# Patient Record
Sex: Female | Born: 2000
Health system: Southern US, Community
[De-identification: ages and names within clinical notes are randomized; demographics above are authoritative.]

## PROBLEM LIST (undated history)

## (undated) DIAGNOSIS — R32 Unspecified urinary incontinence: Secondary | ICD-10-CM

## (undated) DIAGNOSIS — T7422XA Child sexual abuse, confirmed, initial encounter: Secondary | ICD-10-CM

## (undated) DIAGNOSIS — D649 Anemia, unspecified: Secondary | ICD-10-CM

## (undated) DIAGNOSIS — Z30017 Encounter for initial prescription of implantable subdermal contraceptive: Principal | ICD-10-CM

## (undated) DIAGNOSIS — K219 Gastro-esophageal reflux disease without esophagitis: Secondary | ICD-10-CM

## (undated) DIAGNOSIS — F909 Attention-deficit hyperactivity disorder, unspecified type: Secondary | ICD-10-CM

## (undated) HISTORY — DX: Unspecified urinary incontinence: R32

## (undated) HISTORY — DX: Anemia, unspecified: D64.9

## (undated) HISTORY — DX: Encounter for initial prescription of implantable subdermal contraceptive: Z30.017

## (undated) HISTORY — DX: Gastro-esophageal reflux disease without esophagitis: K21.9

---
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
## 2019-11-27 ENCOUNTER — Ambulatory Visit: Payer: Medicaid Other | Attending: Internal Medicine

## 2019-11-27 DIAGNOSIS — Z23 Encounter for immunization: Secondary | ICD-10-CM

## 2019-11-27 NOTE — Progress Notes (Signed)
   Covid-19 Vaccination Clinic  Name:  Lisa Gregory    MRN: [id number] DOB: 04-01-2001  11/27/2019  [name] was observed post Covid-19 immunization for 15 minutes without incident. She was provided with Vaccine Information Sheet and instruction to access the V-Safe system.   [name] was instructed to call 911 with any severe reactions post vaccine: Marland Kitchen Difficulty breathing  . Swelling of face and throat  . A fast heartbeat  . A bad rash all over body  . Dizziness and weakness   Immunizations Administered    Name Date Dose VIS Date Route   Pfizer COVID-19 Vaccine 11/27/2019  2:33 PM 0.3 mL 06/04/2018 Intramuscular   Manufacturer: [employer], [employer]   Lot: [id number]   NDC: [address]-7

## 2019-12-18 ENCOUNTER — Ambulatory Visit: Payer: Medicaid Other | Attending: Internal Medicine

## 2019-12-18 DIAGNOSIS — Z23 Encounter for immunization: Secondary | ICD-10-CM

## 2019-12-18 NOTE — Progress Notes (Signed)
   Covid-19 Vaccination Clinic  Name:  Lisa Gregory    MRN: [id number] DOB: 2001-03-03  12/18/2019  Ms. Lisa Gregory was observed post Covid-19 immunization for 15 minutes without incident. She was provided with Vaccine Information Sheet and instruction to access the V-Safe system.   Ms. Lisa Gregory was instructed to call 911 with any severe reactions post vaccine: Marland Kitchen Difficulty breathing  . Swelling of face and throat  . A fast heartbeat  . A bad rash all over body  . Dizziness and weakness   Immunizations Administered    Name Date Dose VIS Date Route   Pfizer COVID-19 Vaccine 12/18/2019  2:19 PM 0.3 mL 06/04/2018 Intramuscular   Manufacturer: [employer], [employer]   Lot: [id number]   NDC: [address]-4

## 2020-02-28 DIAGNOSIS — Z79899 Other long term (current) drug therapy: Secondary | ICD-10-CM | POA: Insufficient documentation

## 2020-02-28 DIAGNOSIS — R002 Palpitations: Secondary | ICD-10-CM | POA: Diagnosis not present

## 2020-02-29 ENCOUNTER — Emergency Department (HOSPITAL_COMMUNITY)
Admission: EM | Admit: 2020-02-29 | Discharge: 2020-02-29 | Disposition: A | Discharge status: Home / Self Care | Payer: Medicaid Other | Attending: Emergency Medicine | Admitting: Emergency Medicine

## 2020-02-29 ENCOUNTER — Emergency Department (HOSPITAL_COMMUNITY): Payer: Medicaid Other

## 2020-02-29 ENCOUNTER — Encounter (HOSPITAL_COMMUNITY): Payer: Self-pay | Admitting: Emergency Medicine

## 2020-02-29 ENCOUNTER — Other Ambulatory Visit: Payer: Self-pay

## 2020-02-29 DIAGNOSIS — R002 Palpitations: Secondary | ICD-10-CM

## 2020-02-29 LAB — COMPREHENSIVE METABOLIC PANEL
ALT: 17 U/L (ref 0–44)
AST: 20 U/L (ref 15–41)
Albumin: 4 g/dL (ref 3.5–5.0)
Alkaline Phosphatase: 39 U/L (ref 38–126)
Anion gap: 7 (ref 5–15)
BUN: 14 mg/dL (ref 6–20)
CO2: 22 mmol/L (ref 22–32)
Calcium: 9.3 mg/dL (ref 8.9–10.3)
Chloride: 107 mmol/L (ref 98–111)
Creatinine, Ser: 0.58 mg/dL (ref 0.44–1.00)
GFR, Estimated: >60 mL/min (ref >60)
Glucose, Bld: 92 mg/dL (ref 70–99)
Potassium: 3.4 mmol/L — ABNORMAL LOW (ref 3.5–5.1)
Sodium: 136 mmol/L (ref 135–145)
Total Bilirubin: 0.3 mg/dL (ref 0.3–1.2)
Total Protein: 7.8 g/dL (ref 6.5–8.1)

## 2020-02-29 LAB — CBC WITH DIFFERENTIAL/PLATELET
Abs Immature Granulocytes: 0.02 10*3/uL (ref 0.00–0.07)
Basophils Absolute: 0.1 10*3/uL (ref 0.0–0.1)
Basophils Relative: 1 %
Eosinophils Absolute: 0 10*3/uL (ref 0.0–0.5)
Eosinophils Relative: 0 %
HCT: 32.1 % — ABNORMAL LOW (ref 36.0–46.0)
Hemoglobin: 10.2 g/dL — ABNORMAL LOW (ref 12.0–15.0)
Immature Granulocytes: 0 %
Lymphocytes Relative: 26 %
Lymphs Abs: 2.3 10*3/uL (ref 0.7–4.0)
MCH: 27.3 pg (ref 26.0–34.0)
MCHC: 31.8 g/dL (ref 30.0–36.0)
MCV: 85.8 fL (ref 80.0–100.0)
Monocytes Absolute: 0.9 10*3/uL (ref 0.1–1.0)
Monocytes Relative: 10 %
Neutro Abs: 5.7 10*3/uL (ref 1.7–7.7)
Neutrophils Relative %: 63 %
Platelets: 306 10*3/uL (ref 150–400)
RBC: 3.74 MIL/uL — ABNORMAL LOW (ref 3.87–5.11)
RDW: 15.2 % (ref 11.5–15.5)
WBC: 8.9 10*3/uL (ref 4.0–10.5)
nRBC: 0 % (ref 0.0–0.2)

## 2020-02-29 LAB — TROPONIN I (HIGH SENSITIVITY)
Troponin I (High Sensitivity): <2 ng/L (ref <18)
Troponin I (High Sensitivity): <2 ng/L (ref <18)

## 2020-02-29 LAB — MAGNESIUM: Magnesium: 1.8 mg/dL (ref 1.7–2.4)

## 2020-02-29 LAB — D-DIMER, QUANTITATIVE: D-Dimer, Quant: 0.55 ug/mL-FEU — ABNORMAL HIGH (ref 0.00–0.50)

## 2020-02-29 LAB — HCG, SERUM, QUALITATIVE: Preg, Serum: NEGATIVE

## 2020-02-29 LAB — TSH: TSH: 1.598 u[IU]/mL (ref 0.350–4.500)

## 2020-02-29 MED ORDER — IOHEXOL 350 MG/ML SOLN
100.0000 mL | Freq: Once | INTRAVENOUS | Status: AC | PRN
  Administered 2020-02-29: 100 mL via INTRAVENOUS

## 2020-02-29 NOTE — ED Notes (Signed)
Patient transported to CT 

## 2020-02-29 NOTE — Discharge Instructions (Addendum)
Please avoid caffeine-containing products, these will make your heart race.  [facility] has a lot of caffeine in it.  If you continue to feel like your heart is racing you can be evaluated by cardiology.  Call their office to get an appointment.

## 2020-02-29 NOTE — ED Notes (Signed)
Pt awaiting to go for CT Scan at this time. CT notified of negative pregnancy result.
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
## 2020-02-29 NOTE — ED Triage Notes (Signed)
Pt here from work. States she started feeling her heart race after drinking an energy drink at work [employer]. States she usually doesn't drink energy drinks.

## 2020-04-07 ENCOUNTER — Other Ambulatory Visit: Payer: Self-pay

## 2020-04-07 ENCOUNTER — Ambulatory Visit (INDEPENDENT_AMBULATORY_CARE_PROVIDER_SITE_OTHER): Payer: Medicaid Other | Admitting: Nurse Practitioner

## 2020-04-07 ENCOUNTER — Encounter: Payer: Self-pay | Admitting: Nurse Practitioner

## 2020-04-07 VITALS — BP 121/74 | HR 87 | Temp 98.4°F | Ht 64.0 in | Wt 142.6 lb

## 2020-04-07 DIAGNOSIS — K219 Gastro-esophageal reflux disease without esophagitis: Secondary | ICD-10-CM | POA: Insufficient documentation

## 2020-04-07 DIAGNOSIS — Z7689 Persons encountering health services in other specified circumstances: Secondary | ICD-10-CM

## 2020-04-07 NOTE — Assessment & Plan Note (Signed)
Patient is a 19 year old female who is establishing care today.  Patient has no new concerns, completed head to toe assessment.  Provided education to patient on health maintenance and preventative care.  Patient will schedule a 3 months appointment for  physical to complete labs and PAP.

## 2020-04-07 NOTE — Patient Instructions (Signed)
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
## 2020-04-07 NOTE — Assessment & Plan Note (Signed)
Patient is a 19 year old female who presents to clinic following up for GERD.  Patient is well controlled on current medication Nexium 40 mg capsule daily.  No changes to current medication.  Continue to advise patient to eat a healthy diet and exercise as tolerated.

## 2020-07-07 ENCOUNTER — Encounter: Payer: Self-pay | Admitting: Nurse Practitioner

## 2020-07-07 ENCOUNTER — Other Ambulatory Visit (HOSPITAL_COMMUNITY)
Admission: RE | Admit: 2020-07-07 | Discharge: 2020-07-07 | Disposition: A | Discharge status: Home / Self Care | Payer: Medicaid Other | Source: Ambulatory Visit | Attending: Nurse Practitioner | Admitting: Nurse Practitioner

## 2020-07-07 ENCOUNTER — Other Ambulatory Visit: Payer: Self-pay

## 2020-07-07 ENCOUNTER — Ambulatory Visit: Payer: Medicaid Other | Admitting: Nurse Practitioner

## 2020-07-07 VITALS — BP 125/76 | HR 103 | Temp 97.2°F | Ht 64.0 in | Wt 143.0 lb

## 2020-07-07 DIAGNOSIS — Z01419 Encounter for gynecological examination (general) (routine) without abnormal findings: Secondary | ICD-10-CM | POA: Insufficient documentation

## 2020-07-07 DIAGNOSIS — Z01411 Encounter for gynecological examination (general) (routine) with abnormal findings: Secondary | ICD-10-CM | POA: Diagnosis not present

## 2020-07-07 DIAGNOSIS — Z0000 Encounter for general adult medical examination without abnormal findings: Secondary | ICD-10-CM

## 2020-07-07 DIAGNOSIS — Z0001 Encounter for general adult medical examination with abnormal findings: Secondary | ICD-10-CM | POA: Diagnosis not present

## 2020-07-07 DIAGNOSIS — K219 Gastro-esophageal reflux disease without esophagitis: Secondary | ICD-10-CM | POA: Diagnosis not present

## 2020-07-07 NOTE — Assessment & Plan Note (Signed)
Symptoms of GERD well controlled on Nexium 40 mg tablet by mouth daily.

## 2020-07-07 NOTE — Assessment & Plan Note (Signed)
No signs or symptoms of abnormality.  Completed Pap- Results pending.

## 2020-07-07 NOTE — Assessment & Plan Note (Addendum)
Annual physical exam completed December 2021
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
## 2020-07-07 NOTE — Patient Instructions (Signed)

## 2020-07-08 LAB — CYTOLOGY - PAP
Adequacy: ABSENT
Diagnosis: NEGATIVE

## 2020-07-15 ENCOUNTER — Encounter: Payer: Self-pay | Admitting: Nurse Practitioner

## 2020-07-15 ENCOUNTER — Encounter: Payer: Self-pay | Admitting: Family Medicine

## 2020-07-15 ENCOUNTER — Other Ambulatory Visit: Payer: Self-pay

## 2020-07-15 ENCOUNTER — Ambulatory Visit (INDEPENDENT_AMBULATORY_CARE_PROVIDER_SITE_OTHER): Payer: BC Managed Care – PPO | Admitting: Family Medicine

## 2020-07-15 VITALS — BP 105/63 | HR 97 | Temp 97.8°F | Ht 64.0 in | Wt 142.4 lb

## 2020-07-15 DIAGNOSIS — D500 Iron deficiency anemia secondary to blood loss (chronic): Secondary | ICD-10-CM | POA: Diagnosis not present

## 2020-07-15 DIAGNOSIS — R5383 Other fatigue: Secondary | ICD-10-CM | POA: Diagnosis not present

## 2020-07-15 DIAGNOSIS — R300 Dysuria: Secondary | ICD-10-CM | POA: Diagnosis not present

## 2020-07-15 DIAGNOSIS — N3090 Cystitis, unspecified without hematuria: Secondary | ICD-10-CM

## 2020-07-15 DIAGNOSIS — R000 Tachycardia, unspecified: Secondary | ICD-10-CM

## 2020-07-15 LAB — URINALYSIS
Bilirubin, UA: NEGATIVE
Glucose, UA: NEGATIVE
Ketones, UA: NEGATIVE
Nitrite, UA: POSITIVE — AB
RBC, UA: NEGATIVE
Specific Gravity, UA: 1.02 (ref 1.005–1.030)
Urobilinogen, Ur: 1 mg/dL (ref 0.2–1.0)
pH, UA: 7 (ref 5.0–7.5)

## 2020-07-15 LAB — HEMOGLOBIN, FINGERSTICK: Hemoglobin: 9.2 g/dL — ABNORMAL LOW (ref 11.1–15.9)

## 2020-07-15 MED ORDER — FERROUS FUMARATE 324 (106 FE) MG PO TABS: 1.0000 | ORAL_TABLET | Freq: Two times a day (BID) | ORAL | 2 refills | Status: DC

## 2020-07-15 MED ORDER — SULFAMETHOXAZOLE-TRIMETHOPRIM 800-160 MG PO TABS: 1.0000 | ORAL_TABLET | Freq: Two times a day (BID) | ORAL | 0 refills | Status: DC
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
## 2020-07-16 ENCOUNTER — Encounter: Payer: Self-pay | Admitting: Family Medicine

## 2020-07-16 LAB — CBC WITH DIFFERENTIAL/PLATELET
Basophils Absolute: 0.1 10*3/uL (ref 0.0–0.2)
Basos: 1 %
EOS (ABSOLUTE): 0.1 10*3/uL (ref 0.0–0.4)
Eos: 1 %
Hematocrit: 34 % (ref 34.0–46.6)
Hemoglobin: 11.1 g/dL (ref 11.1–15.9)
Immature Grans (Abs): 0 10*3/uL (ref 0.0–0.1)
Immature Granulocytes: 0 %
Lymphocytes Absolute: 2.5 10*3/uL (ref 0.7–3.1)
Lymphs: 29 %
MCH: 27.7 pg (ref 26.6–33.0)
MCHC: 32.6 g/dL (ref 31.5–35.7)
MCV: 85 fL (ref 79–97)
Monocytes Absolute: 0.9 10*3/uL (ref 0.1–0.9)
Monocytes: 10 %
Neutrophils Absolute: 5.2 10*3/uL (ref 1.4–7.0)
Neutrophils: 59 %
Platelets: 312 10*3/uL (ref 150–450)
RBC: 4.01 x10E6/uL (ref 3.77–5.28)
RDW: 13.7 % (ref 11.7–15.4)
WBC: 8.7 10*3/uL (ref 3.4–10.8)

## 2020-07-16 LAB — CMP14+EGFR
ALT: 22 IU/L (ref 0–32)
AST: 18 IU/L (ref 0–40)
Albumin/Globulin Ratio: 1.3 (ref 1.2–2.2)
Albumin: 4.5 g/dL (ref 3.9–5.0)
Alkaline Phosphatase: 68 IU/L (ref 44–121)
BUN/Creatinine Ratio: 21 (ref 9–23)
BUN: 11 mg/dL (ref 6–20)
Bilirubin Total: 0.2 mg/dL (ref 0.0–1.2)
CO2: 20 mmol/L (ref 20–29)
Calcium: 9.7 mg/dL (ref 8.7–10.2)
Chloride: 103 mmol/L (ref 96–106)
Creatinine, Ser: 0.53 mg/dL — ABNORMAL LOW (ref 0.57–1.00)
Globulin, Total: 3.6 g/dL (ref 1.5–4.5)
Glucose: 75 mg/dL (ref 65–99)
Potassium: 3.9 mmol/L (ref 3.5–5.2)
Sodium: 140 mmol/L (ref 134–144)
Total Protein: 8.1 g/dL (ref 6.0–8.5)
eGFR: 135 mL/min/{1.73_m2} (ref >59)

## 2020-07-18 LAB — URINE CULTURE

## 2020-07-19 ENCOUNTER — Other Ambulatory Visit: Payer: Self-pay | Admitting: Family Medicine

## 2020-07-19 MED ORDER — NITROFURANTOIN MONOHYD MACRO 100 MG PO CAPS: 100.0000 mg | ORAL_CAPSULE | Freq: Two times a day (BID) | ORAL | 0 refills | Status: DC

## 2020-07-19 NOTE — Progress Notes (Signed)
Your urine culture shows the presence of a germ that is resistant to the current antibiotic you are taking. Please discontinue that medication and take the new one I have sent to your pharmacy.  Best Regards, Leaha Cuervo, M.D.  

## 2020-08-10 ENCOUNTER — Encounter: Payer: Self-pay | Admitting: Nurse Practitioner

## 2020-08-21 ENCOUNTER — Emergency Department (HOSPITAL_COMMUNITY)
Admission: EM | Admit: 2020-08-21 | Discharge: 2020-08-22 | Disposition: A | Discharge status: Home / Self Care | Payer: BC Managed Care – PPO | Attending: Emergency Medicine | Admitting: Emergency Medicine

## 2020-08-21 ENCOUNTER — Emergency Department (HOSPITAL_COMMUNITY): Payer: BC Managed Care – PPO

## 2020-08-21 ENCOUNTER — Other Ambulatory Visit: Payer: Self-pay

## 2020-08-21 ENCOUNTER — Encounter (HOSPITAL_COMMUNITY): Payer: Self-pay

## 2020-08-21 DIAGNOSIS — R000 Tachycardia, unspecified: Secondary | ICD-10-CM | POA: Insufficient documentation

## 2020-08-21 DIAGNOSIS — U071 COVID-19: Secondary | ICD-10-CM

## 2020-08-21 DIAGNOSIS — R7401 Elevation of levels of liver transaminase levels: Secondary | ICD-10-CM

## 2020-08-21 DIAGNOSIS — R059 Cough, unspecified: Secondary | ICD-10-CM | POA: Diagnosis present

## 2020-08-21 DIAGNOSIS — M549 Dorsalgia, unspecified: Secondary | ICD-10-CM | POA: Diagnosis not present

## 2020-08-21 DIAGNOSIS — N3090 Cystitis, unspecified without hematuria: Secondary | ICD-10-CM

## 2020-08-21 LAB — RESP PANEL BY RT-PCR (FLU A&B, COVID) ARPGX2
Influenza A by PCR: NEGATIVE
Influenza B by PCR: NEGATIVE
SARS Coronavirus 2 by RT PCR: POSITIVE — AB

## 2020-08-21 LAB — CBC WITH DIFFERENTIAL/PLATELET
Abs Immature Granulocytes: 0.03 10*3/uL (ref 0.00–0.07)
Basophils Absolute: 0 10*3/uL (ref 0.0–0.1)
Basophils Relative: 0 %
Eosinophils Absolute: 0 10*3/uL (ref 0.0–0.5)
Eosinophils Relative: 0 %
HCT: 32.4 % — ABNORMAL LOW (ref 36.0–46.0)
Hemoglobin: 10.7 g/dL — ABNORMAL LOW (ref 12.0–15.0)
Immature Granulocytes: 0 %
Lymphocytes Relative: 9 %
Lymphs Abs: 0.8 10*3/uL (ref 0.7–4.0)
MCH: 28.8 pg (ref 26.0–34.0)
MCHC: 33 g/dL (ref 30.0–36.0)
MCV: 87.3 fL (ref 80.0–100.0)
Monocytes Absolute: 0.8 10*3/uL (ref 0.1–1.0)
Monocytes Relative: 8 %
Neutro Abs: 7.7 10*3/uL (ref 1.7–7.7)
Neutrophils Relative %: 83 %
Platelets: 232 10*3/uL (ref 150–400)
RBC: 3.71 MIL/uL — ABNORMAL LOW (ref 3.87–5.11)
RDW: 14.3 % (ref 11.5–15.5)
WBC: 9.4 10*3/uL (ref 4.0–10.5)
nRBC: 0 % (ref 0.0–0.2)

## 2020-08-21 MED ORDER — BENZONATATE 100 MG PO CAPS: 100.0000 mg | ORAL_CAPSULE | Freq: Three times a day (TID) | ORAL | 0 refills | Status: AC

## 2020-08-21 MED ORDER — ACETAMINOPHEN 500 MG PO TABS
1000.0000 mg | ORAL_TABLET | Freq: Once | ORAL | Status: AC
  Administered 2020-08-21: 1000 mg via ORAL
  Filled 2020-08-21: qty 2

## 2020-08-21 MED ORDER — ONDANSETRON HCL 4 MG PO TABS: 4.0000 mg | ORAL_TABLET | Freq: Four times a day (QID) | ORAL | 0 refills | Status: DC

## 2020-08-21 MED ORDER — SODIUM CHLORIDE 0.9 % IV BOLUS
1000.0000 mL | Freq: Once | INTRAVENOUS | Status: AC
  Administered 2020-08-21: 1000 mL via INTRAVENOUS

## 2020-08-21 MED ORDER — ONDANSETRON HCL 4 MG/2ML IJ SOLN
4.0000 mg | Freq: Once | INTRAMUSCULAR | Status: AC
  Administered 2020-08-21: 4 mg via INTRAVENOUS
  Filled 2020-08-21: qty 2

## 2020-08-21 MED ORDER — ACETAMINOPHEN 325 MG PO TABS
650.0000 mg | ORAL_TABLET | Freq: Once | ORAL | Status: AC
  Administered 2020-08-21: 650 mg via ORAL
  Filled 2020-08-21: qty 2

## 2020-08-21 NOTE — ED Triage Notes (Signed)
Pt arrives via POV c/o mid back pain that began at work at 0300 today. Pt reports lifting a heavy object at work when she felt a sharp, stabbing pain traveling down her back. Pt reports no relief with OTC NSAIDS.

## 2020-08-21 NOTE — ED Notes (Signed)
Pt denied n&v but after administering ordered Tylenol, pt began vomiting for several minutes. Large amount of light yellow, liquid emesis noted. PA notified.

## 2020-08-21 NOTE — Discharge Instructions (Addendum)
Take tessalon as directed. You were given a prescription for zofran to help with your nausea. Please take as directed. Take the antibiotic for possible urinary tract infection.  Your liver function today is slightly elevated and you should follow-up with your doctor regarding this.  It Is likely of elevated because of her COVID infection.  Return to the ED with abdominal pain, fever or vomiting  You should be isolated for at least 5 days since the onset of your symptoms AND >72 hours after symptoms resolution (absence of fever without the use of fever reducing medication and improvement in respiratory symptoms), whichever is longer  Please follow up with your primary care provider within 5-7 days for re-evaluation of your symptoms. If you do not have a primary care provider, information for a healthcare clinic has been provided for you to make arrangements for follow up care. Please return to the emergency department for any new or worsening symptoms including shortness of breath or chest pain.
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
## 2020-08-22 ENCOUNTER — Telehealth: Payer: Self-pay | Admitting: Nurse Practitioner

## 2020-08-22 LAB — COMPREHENSIVE METABOLIC PANEL
ALT: 154 U/L — ABNORMAL HIGH (ref 0–44)
AST: 487 U/L — ABNORMAL HIGH (ref 15–41)
Albumin: 3.7 g/dL (ref 3.5–5.0)
Alkaline Phosphatase: 77 U/L (ref 38–126)
Anion gap: 7 (ref 5–15)
BUN: 7 mg/dL (ref 6–20)
CO2: 20 mmol/L — ABNORMAL LOW (ref 22–32)
Calcium: 8.5 mg/dL — ABNORMAL LOW (ref 8.9–10.3)
Chloride: 106 mmol/L (ref 98–111)
Creatinine, Ser: 0.45 mg/dL (ref 0.44–1.00)
GFR, Estimated: >60 mL/min (ref >60)
Glucose, Bld: 99 mg/dL (ref 70–99)
Potassium: 3 mmol/L — ABNORMAL LOW (ref 3.5–5.1)
Sodium: 133 mmol/L — ABNORMAL LOW (ref 135–145)
Total Bilirubin: 0.7 mg/dL (ref 0.3–1.2)
Total Protein: 7.5 g/dL (ref 6.5–8.1)

## 2020-08-22 LAB — URINALYSIS, ROUTINE W REFLEX MICROSCOPIC
Bilirubin Urine: NEGATIVE
Glucose, UA: NEGATIVE mg/dL
Ketones, ur: NEGATIVE mg/dL
Leukocytes,Ua: NEGATIVE
Nitrite: POSITIVE — AB
Protein, ur: NEGATIVE mg/dL
Specific Gravity, Urine: 1.005 (ref 1.005–1.030)
pH: 6 (ref 5.0–8.0)

## 2020-08-22 LAB — LIPASE, BLOOD: Lipase: 23 U/L (ref 11–51)

## 2020-08-22 LAB — HCG, QUANTITATIVE, PREGNANCY: hCG, Beta Chain, Quant, S: <1 m[IU]/mL (ref <5)

## 2020-08-22 MED ORDER — POTASSIUM CHLORIDE CRYS ER 20 MEQ PO TBCR
40.0000 meq | EXTENDED_RELEASE_TABLET | Freq: Once | ORAL | Status: AC
  Administered 2020-08-22: 40 meq via ORAL
  Filled 2020-08-22: qty 2

## 2020-08-22 MED ORDER — CEPHALEXIN 500 MG PO CAPS: 500.0000 mg | ORAL_CAPSULE | Freq: Three times a day (TID) | ORAL | 0 refills | Status: DC

## 2020-08-22 MED ORDER — SODIUM CHLORIDE 0.9 % IV BOLUS
1000.0000 mL | Freq: Once | INTRAVENOUS | Status: AC
  Administered 2020-08-22: 1000 mL via INTRAVENOUS

## 2020-08-22 NOTE — Telephone Encounter (Signed)
Called to discuss with patient about COVID-19 symptoms and the use of one of the available treatments for those with mild to moderate Covid symptoms and at a high risk of hospitalization.  Pt appears to qualify for outpatient treatment due to co-morbid conditions and/or a member of an at-risk group in accordance with the FDA Emergency Use Authorization.    Symptom onset: 08/20/2020 Vaccinated: Yes Booster? No Immunocompromised? No Qualifiers: AA, smoker NIH Criteria: 4  Unable to reach pt - Voicemail and Mychart message sent.   Willette Alma, NP COVID Treatment Team [phone]

## 2020-08-22 NOTE — ED Provider Notes (Signed)
Care assumed from [facility] Patient with back pain, nasal congestion, cough, sore throat.  No abdominal pain, vomiting or urinary symptoms.  Found to be COVID-positive.  Febrile and tachycardic.  Transaminitis noted likely secondary to COVID infection.  No abdominal pain.  Patient given IV fluids and antipyretics.  Heart Rate is normalized to the 80s.  Patient is tolerating p.o.  No hypoxia or increased work of breathing.  Chest x-ray is negative.  Will treat supportively for likely COVID infection with possible UTI. Return precautions discussed  BP 122/74   Pulse 85   Temp 98.7 F (37.1 C)   Resp 18   Ht 5\' 4"  (1.626 m)   Wt 63.5 kg   LMP 08/07/2020 (Approximate)   SpO2 98%   BMI 24.03 kg/m     08/09/2020, MD 08/22/20 [phone]

## 2020-08-23 LAB — URINE CULTURE

## 2021-09-07 ENCOUNTER — Encounter: Payer: Self-pay | Admitting: Nurse Practitioner

## 2021-09-07 ENCOUNTER — Ambulatory Visit: Payer: Medicaid Other | Admitting: Nurse Practitioner

## 2021-09-07 VITALS — BP 107/70 | HR 95 | Temp 98.6°F | Ht 63.0 in | Wt 152.2 lb

## 2021-09-07 DIAGNOSIS — Z202 Contact with and (suspected) exposure to infections with a predominantly sexual mode of transmission: Secondary | ICD-10-CM | POA: Diagnosis not present

## 2021-09-07 DIAGNOSIS — Z7689 Persons encountering health services in other specified circumstances: Secondary | ICD-10-CM | POA: Diagnosis not present

## 2021-09-07 NOTE — Progress Notes (Signed)
Acute Office Visit  Subjective:     Patient ID: Lisa Gregory, female    DOB: [birth year], 21 y.o.   MRN: [id number]  Chief Complaint  Patient presents with   Exposure to STD    No sxs , just wanted to be checked    Exposure to STD  The patient's pertinent negatives include no discharge, dyspareunia, dysuria, genital itching or pelvic pain. This is a new problem. The current episode started in the past 7 days. The problem has been unchanged. The vaginal discharge was normal. Pertinent negatives include no abdominal pain, fever, sore throat or urinary frequency. She has tried nothing for the symptoms.  Patient is not having any symptoms, she wants to make sure partner is has not transmitted any STD.   Review of Systems  Constitutional: Negative.  Negative for chills and fever.  HENT: Negative.  Negative for sore throat.   Eyes: Negative.   Respiratory: Negative.    Cardiovascular: Negative.   Gastrointestinal:  Negative for abdominal pain.  Genitourinary: Negative.  Negative for dyspareunia, dysuria, frequency and pelvic pain.  Skin: Negative.  Negative for rash.  All other systems reviewed and are negative.      Objective:    BP 107/70   Pulse 95   Temp 98.6 F (37 C)   Ht 5\' 3"  (1.6 m)   Wt 152 lb 3.2 oz (69 kg)   LMP 09/03/2021 (Exact Date) Comment: started  SpO2 95%   BMI 26.96 kg/m  BP Readings from Last 3 Encounters:  09/07/21 107/70  08/22/20 122/74  07/15/20 105/63   Wt Readings from Last 3 Encounters:  09/07/21 152 lb 3.2 oz (69 kg)  08/21/20 140 lb (63.5 kg)  07/15/20 142 lb 6.4 oz (64.6 kg)      Physical Exam Vitals and nursing note reviewed.  Constitutional:      Appearance: Normal appearance.  HENT:     Head: Normocephalic.     Right Ear: External ear normal.     Left Ear: External ear normal.     Nose: Nose normal.     Mouth/Throat:     Mouth: Mucous membranes are moist.  Eyes:     Conjunctiva/sclera: Conjunctivae normal.   Cardiovascular:     Rate and Rhythm: Normal rate and regular rhythm.     Pulses: Normal pulses.     Heart sounds: Normal heart sounds.  Pulmonary:     Effort: Pulmonary effort is normal.     Breath sounds: Normal breath sounds.  Abdominal:     General: Bowel sounds are normal.  Musculoskeletal:        General: Normal range of motion.  Skin:    General: Skin is warm.     Findings: No rash.  Neurological:     General: No focal deficit present.     Mental Status: She is alert and oriented to person, place, and time.  Psychiatric:        Mood and Affect: Mood normal.        Behavior: Behavior normal.    No results found for any visits on 09/07/21.      Assessment & Plan:  Patient presents with no signs or symptoms of STD, only wanting to make sure she has not contracted STD from partner.  Completed urine STD lab testing results pending. Education provided to patient on safe sex.  Printed handouts given.   Problem List Items Addressed This Visit   None Visit Diagnoses  Encounter for assessment of STD exposure    -  Primary   Relevant Orders   Ct, Ng, Mycoplasmas NAA, Urine       No orders of the defined types were placed in this encounter.   Return if symptoms worsen or fail to improve.  Daryll Drown, NP

## 2021-09-07 NOTE — Patient Instructions (Signed)
Safe Sex Practicing safe sex means taking steps before and during sex to reduce your risk of: Getting an STI (sexually transmitted infection). Giving your partner an STI. Unwanted or unplanned pregnancy. How to practice safe sex Ways you can practice safe sex  Limit your sexual partners to only one partner who is having sex with only you. Avoid using alcohol and drugs before having sex. Alcohol and drugs can affect your judgment. Before having sex with a new partner: Talk to your partner about past partners, past STIs, and drug use. Get screened for STIs and discuss the results with your partner. Ask your partner to get screened too. Check your body regularly for sores, blisters, rashes, or unusual discharge. If you notice any of these problems, visit your health care provider. Avoid sexual contact if you have symptoms of an infection or you are being treated for an STI. While having sex, use a condom. Make sure to: Use a condom every time you have vaginal, oral, or anal sex. Both females and males should wear condoms during oral sex. Keep condoms in place from the beginning to the end of sexual activity. Use a latex condom, if possible. Latex condoms offer the best protection. Use only water-based lubricants with a condom. Using petroleum-based lubricants or oils will weaken the condom and increase the chance that it will break. Ways your health care provider can help you practice safe sex  See your health care provider for regular screenings, exams, and tests for STIs. Talk with your health care provider about what kind of birth control (contraception) is best for you. Get vaccinated against hepatitis B and human papillomavirus (HPV). If you are at risk of being infected with HIV (human immunodeficiency virus), talk with your health care provider about taking a prescription medicine to prevent HIV infection. You are at risk for HIV if you: Are a man who has sex with other men. Are  sexually active with more than one partner. Take drugs by injection. Have a sex partner who has HIV. Have unprotected sex. Have sex with someone who has sex with both men and women. Have had an STI. Follow these instructions at home: Take over-the-counter and prescription medicines only as told by your health care provider. Keep all follow-up visits. This is important. Where to find more information Centers for Disease Control and Prevention: www.cdc.gov Planned Parenthood: www.plannedparenthood.org Office on Women's Health: www.womenshealth.gov Summary Practicing safe sex means taking steps before and during sex to reduce your risk getting an STI, giving your partner an STI, and having an unwanted or unplanned pregnancy. Before having sex with a new partner, talk to your partner about past partners, past STIs, and drug use. Use a condom every time you have vaginal, oral, or anal sex. Both females and males should wear condoms during oral sex. Check your body regularly for sores, blisters, rashes, or unusual discharge. If you notice any of these problems, visit your health care provider. See your health care provider for regular screenings, exams, and tests for STIs. This information is not intended to replace advice given to you by your health care provider. Make sure you discuss any questions you have with your health care provider. Document Revised: 09/01/2019 Document Reviewed: 09/01/2019 Elsevier Patient Education  2023 Elsevier Inc.  

## 2021-09-13 ENCOUNTER — Other Ambulatory Visit: Payer: Self-pay | Admitting: Nurse Practitioner

## 2021-09-13 ENCOUNTER — Telehealth: Payer: Self-pay | Admitting: Nurse Practitioner

## 2021-09-13 DIAGNOSIS — A749 Chlamydial infection, unspecified: Secondary | ICD-10-CM

## 2021-09-13 LAB — CT, NG, MYCOPLASMAS NAA, URINE
Chlamydia trachomatis, NAA: POSITIVE — AB
Mycoplasma genitalium NAA: NEGATIVE
Mycoplasma hominis NAA: POSITIVE — AB
Neisseria gonorrhoeae, NAA: NEGATIVE
Ureaplasma spp NAA: NEGATIVE

## 2021-09-13 MED ORDER — CEFTRIAXONE SODIUM 1 G IJ SOLR: 1.0000 g | Freq: Once | INTRAMUSCULAR | 0 refills | Status: AC

## 2021-09-13 MED ORDER — AZITHROMYCIN 1 G PO PACK: 1.0000 g | PACK | Freq: Once | ORAL | 0 refills | Status: AC

## 2021-09-13 MED ORDER — METRONIDAZOLE 500 MG PO TABS: 500.0000 mg | ORAL_TABLET | Freq: Two times a day (BID) | ORAL | 0 refills | Status: DC

## 2021-09-13 NOTE — Telephone Encounter (Signed)
Patient  scheduled to get Rocephin 1 g in clinic tomorrow.  That order was placed in error I will cancel it.   thank you

## 2021-09-13 NOTE — Telephone Encounter (Signed)
Pt aware for lab results from 09/07/21. Needs PCP to review results ASAP and advise on what can be done for treatment.

## 2021-09-13 NOTE — Telephone Encounter (Signed)
Patient positive for chlamydia Schedule patient for Rocephin shot in clinic ASAP,  I will send azithromycin and metronidazole to pharmacy

## 2021-09-13 NOTE — Telephone Encounter (Signed)
Pharmacy calling to let us know that they are unable to fill cefTRIAXone (ROCEPHIN) 1 g injection

## 2021-09-14 ENCOUNTER — Ambulatory Visit (INDEPENDENT_AMBULATORY_CARE_PROVIDER_SITE_OTHER): Payer: Medicaid Other | Admitting: Emergency Medicine

## 2021-09-14 DIAGNOSIS — A749 Chlamydial infection, unspecified: Secondary | ICD-10-CM

## 2021-09-14 MED ORDER — CEFTRIAXONE SODIUM 1 G IJ SOLR
1.0000 g | Freq: Once | INTRAMUSCULAR | Status: AC
  Administered 2021-09-14: 1 g via INTRAMUSCULAR

## 2021-09-14 NOTE — Telephone Encounter (Signed)
Noted - pt called and LM to call the office asap to sched a time to come by for the rocephin injection

## 2021-09-21 NOTE — Telephone Encounter (Signed)
Patient had Rocephin injection on 09/14/2021

## 2021-10-25 IMAGING — CT CT ANGIO CHEST
2 of 6 series · 19 of 46 positions shown · IV contrast (Omnipaque or Isovue)
Comparison: None.

CLINICAL DATA: 19-year-old female with chest discomfort and
elevated D-dimer.

EXAM:
CT ANGIOGRAPHY CHEST WITH CONTRAST
TECHNIQUE: Multidetector CT imaging of the chest was performed using the
standard protocol during bolus administration of intravenous
contrast. Multiplanar CT image reconstructions and MIPs were
obtained to evaluate the vascular anatomy.
CONTRAST:  100mL OMNIPAQUE IOHEXOL 350 MG/ML SOLN

[Series 6: pe axial thins · axial · 0.60mm/px · z∈[+1219,+1458]mm · 16 of 263 slices shown]
[im 12/263  lung]
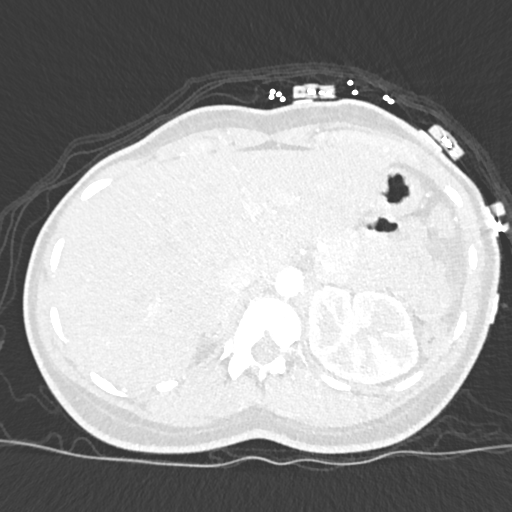
[im 35/263  soft-tissue]
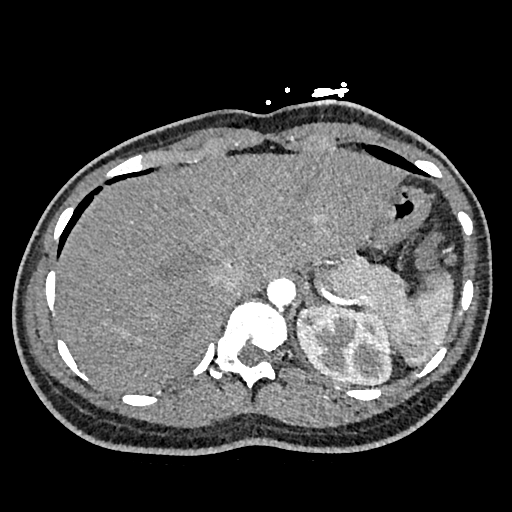
[im 46/263  lung]
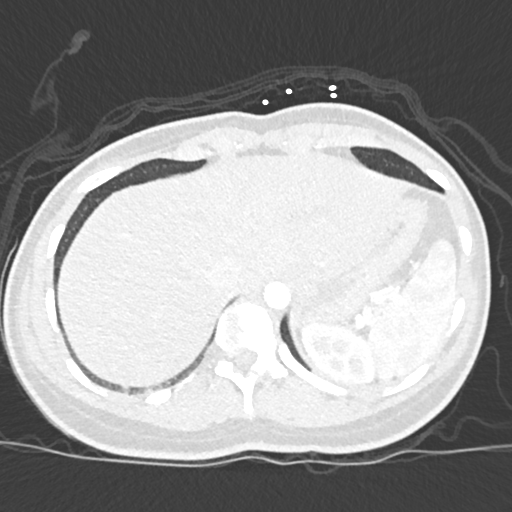
[im 57/263  soft-tissue]
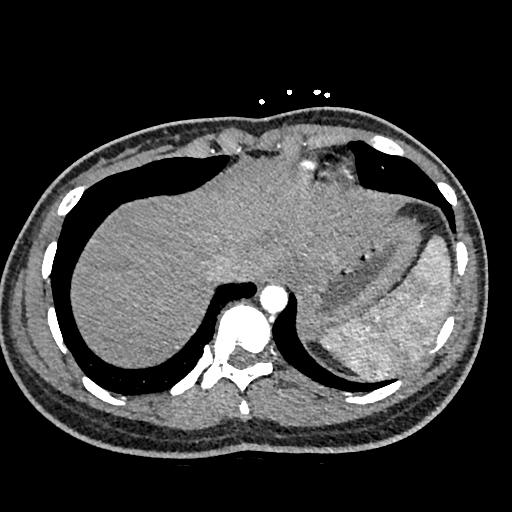
[im 80/263  lung]
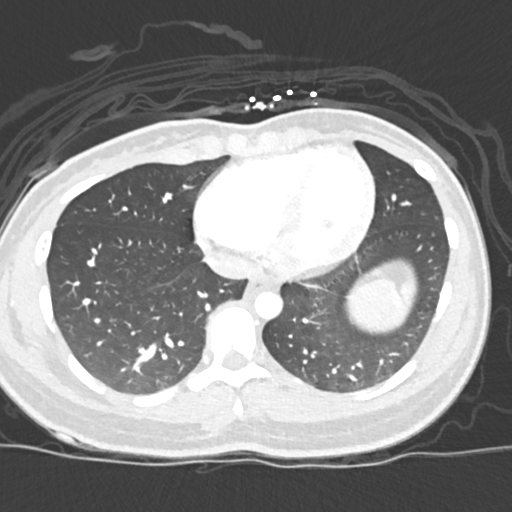
[im 92/263  soft-tissue]
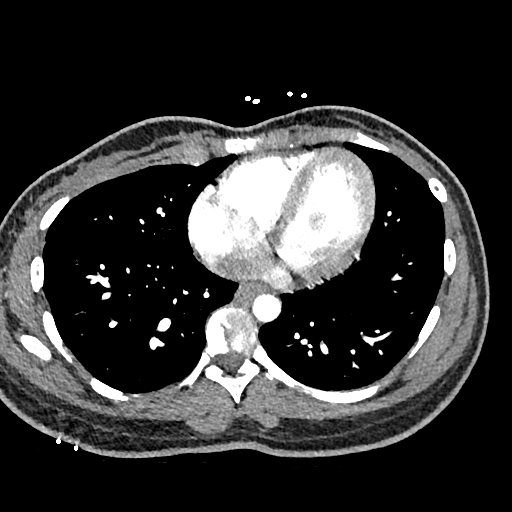
[im 103/263  lung]
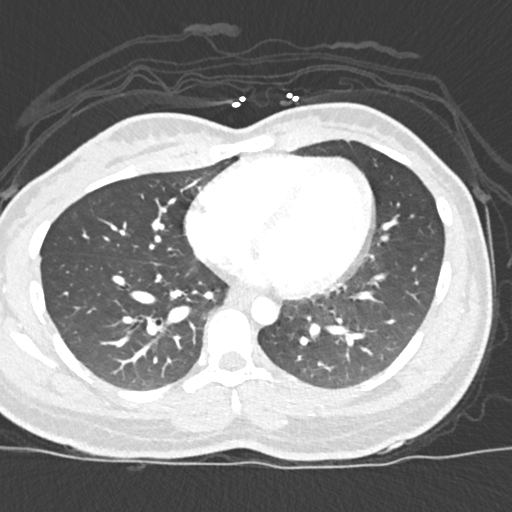
[im 126/263  soft-tissue]
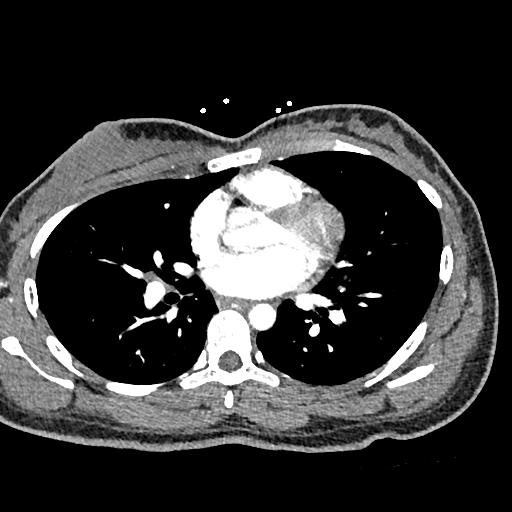
[im 137/263  lung]
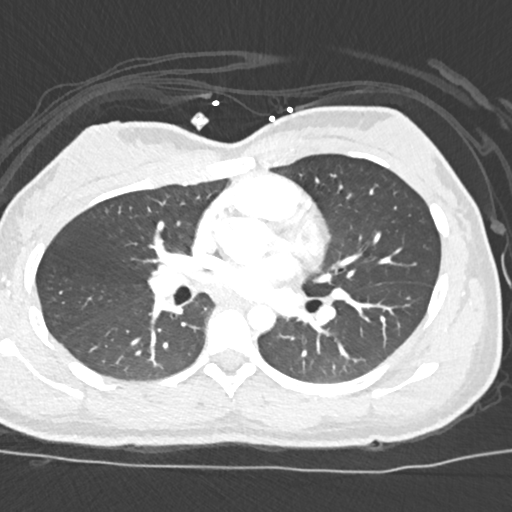
[im 160/263  soft-tissue]
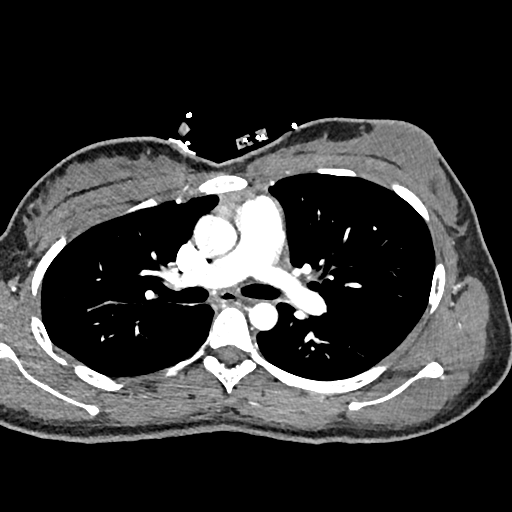
[im 171/263  lung]
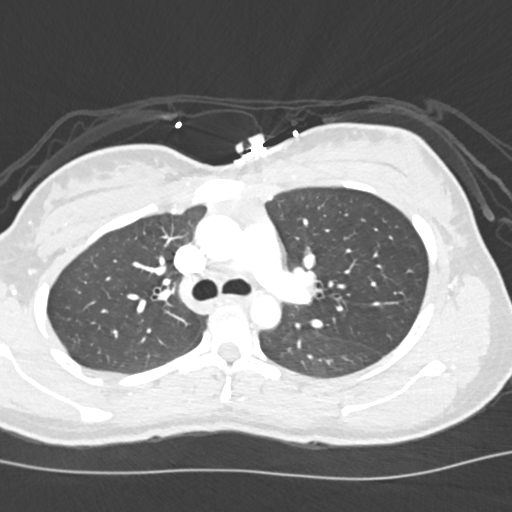
[im 183/263  soft-tissue]
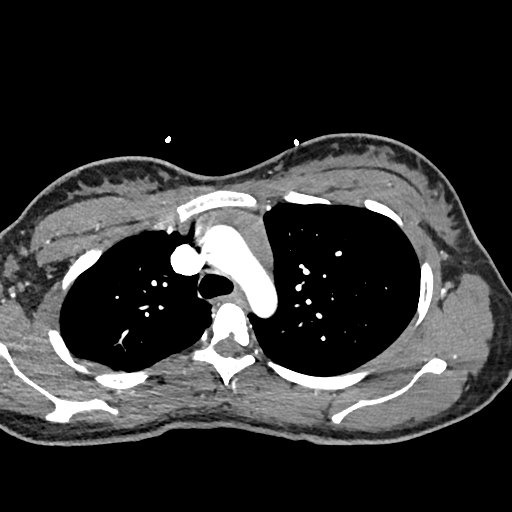
[im 206/263  lung]
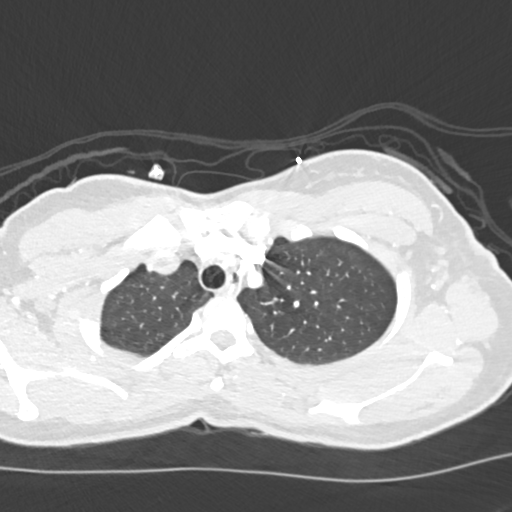
[im 217/263  soft-tissue]
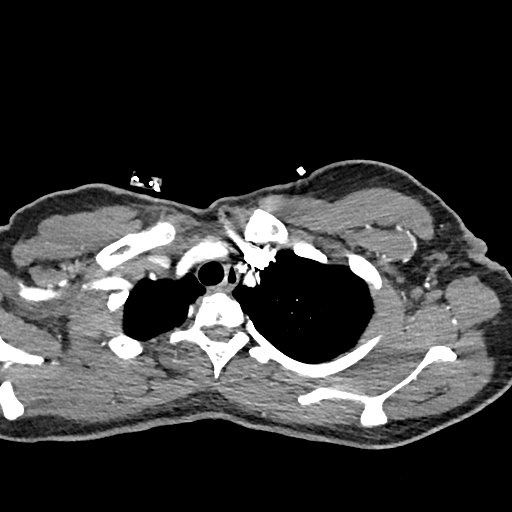
[im 228/263  lung]
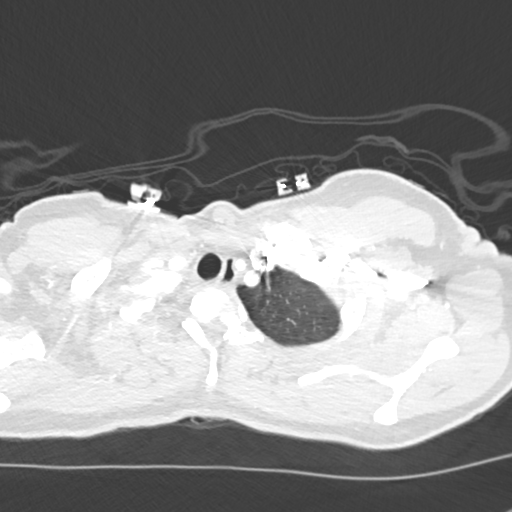
[im 251/263  soft-tissue]
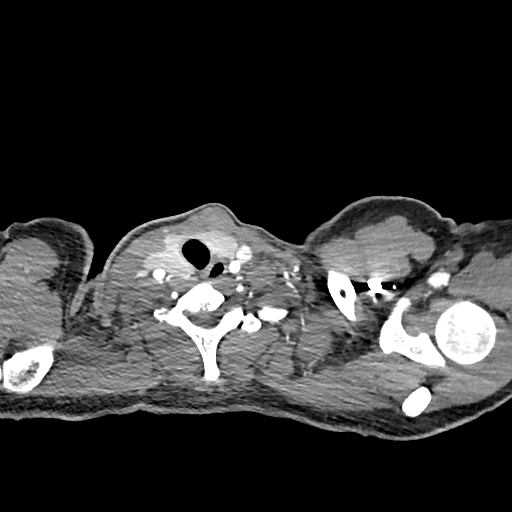

[Series 8: cor soft · coronal · 0.59mm/px · 3 of 110 slices shown]
[im 28/110  soft-tissue]
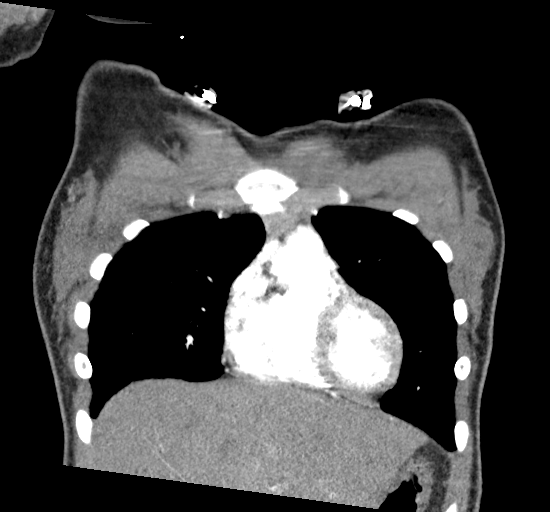
[im 55/110  soft-tissue]
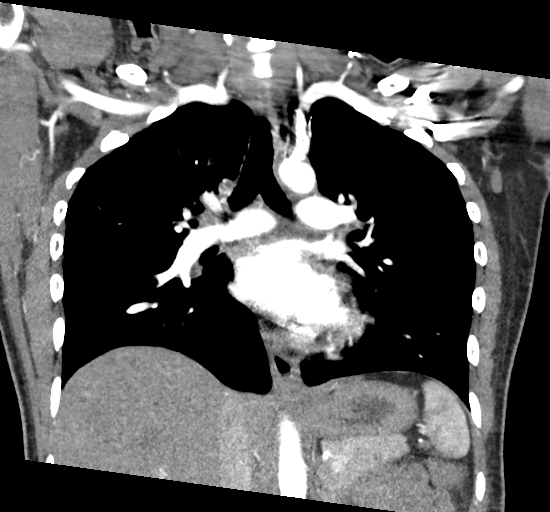
[im 82/110  soft-tissue]
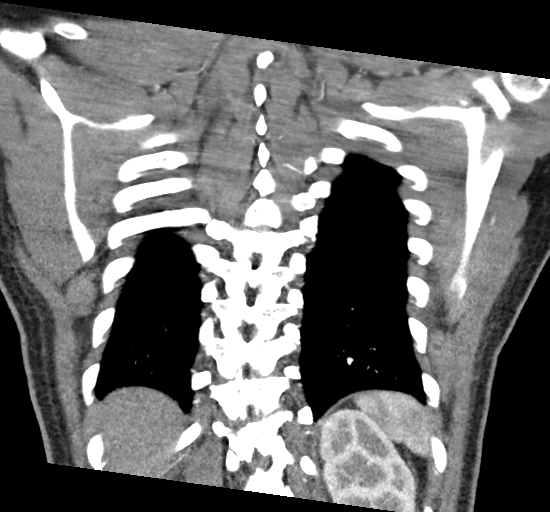

[19 of 46 positions shown; findings below may reference images not displayed]

FINDINGS: Cardiovascular: This is a technically satisfactory study. No
pulmonary emboli are identified. There is no evidence of thoracic
aortic aneurysm or definite dissection. Heart size is normal.

Mediastinum/Nodes: No enlarged mediastinal, hilar, or axillary lymph
nodes. Thyroid gland, trachea, and esophagus demonstrate no
significant findings.

Lungs/Pleura: There is no evidence of airspace disease,
consolidation, mass, suspicious nodule, pleural effusion or
pneumothorax.

Upper Abdomen: No acute abnormality.

Musculoskeletal: No acute or suspicious bony abnormalities.

Review of the MIP images confirms the above findings.
IMPRESSION: No evidence of acute or significant abnormality. No evidence of
pulmonary emboli.

## 2022-02-20 ENCOUNTER — Encounter (HOSPITAL_COMMUNITY): Payer: Self-pay

## 2022-02-20 ENCOUNTER — Other Ambulatory Visit: Payer: Self-pay

## 2022-02-20 ENCOUNTER — Emergency Department (HOSPITAL_COMMUNITY)
Admission: EM | Admit: 2022-02-20 | Discharge: 2022-02-20 | Disposition: A | Discharge status: Home / Self Care | Payer: Medicaid Other | Attending: Emergency Medicine | Admitting: Emergency Medicine

## 2022-02-20 DIAGNOSIS — J069 Acute upper respiratory infection, unspecified: Secondary | ICD-10-CM | POA: Insufficient documentation

## 2022-02-20 DIAGNOSIS — Z20822 Contact with and (suspected) exposure to covid-19: Secondary | ICD-10-CM | POA: Diagnosis not present

## 2022-02-20 DIAGNOSIS — J029 Acute pharyngitis, unspecified: Secondary | ICD-10-CM | POA: Diagnosis present

## 2022-02-20 LAB — RESP PANEL BY RT-PCR (FLU A&B, COVID) ARPGX2
Influenza A by PCR: NEGATIVE
Influenza B by PCR: NEGATIVE
SARS Coronavirus 2 by RT PCR: NEGATIVE

## 2022-02-20 LAB — GROUP A STREP BY PCR: Group A Strep by PCR: NOT DETECTED

## 2022-02-20 NOTE — ED Provider Notes (Signed)
[facility] EMERGENCY DEPARTMENT  Provider Note  CSN: [id number] Arrival date & time: 02/20/22 0319  History Chief Complaint  Patient presents with   Sore Throat    Lisa Gregory is a 21 y.o. female with no significant PMH here for evaluation of about 24 hours of nasal congestion, sore throat and fever. She has not had much cough.    Home Medications Prior to Admission medications   Medication Sig Start Date End Date Taking? Authorizing Provider  esomeprazole (NEXIUM) 40 MG capsule TAKE 1 CAPSULE BY MOUTH IN THE MORNING BEFORE BREAKFAST 03/11/20   [provider]  Ferrous Fumarate (HEMOCYTE) 324 (106 Fe) MG TABS tablet Take 1 tablet (106 mg of iron total) by mouth 2 (two) times daily. 07/15/20   Mechele Claude, MD  metroNIDAZOLE (FLAGYL) 500 MG tablet Take 1 tablet (500 mg total) by mouth 2 (two) times daily. 09/13/21   Daryll Drown, NP     Allergies    Patient has no known allergies.   Review of Systems   Review of Systems Please see HPI for pertinent positives and negatives  Physical Exam BP 114/68 (BP Location: Right Arm)   Pulse (!) 121   Temp 98.5 F (36.9 C) (Oral)   Resp 18   Ht 5\' 3"  (1.6 m)   Wt 69 kg   LMP 02/08/2022 (Approximate)   SpO2 99%   BMI 26.95 kg/m   Physical Exam Vitals and nursing note reviewed.  Constitutional:      Appearance: Normal appearance.  HENT:     Head: Normocephalic and atraumatic.     Nose: Nose normal.     Mouth/Throat:     Mouth: Mucous membranes are moist.     Pharynx: Posterior oropharyngeal erythema present. No oropharyngeal exudate.     Tonsils: No tonsillar exudate.  Eyes:     Extraocular Movements: Extraocular movements intact.     Conjunctiva/sclera: Conjunctivae normal.  Cardiovascular:     Rate and Rhythm: Normal rate.  Pulmonary:     Effort: Pulmonary effort is normal.     Breath sounds: Normal breath sounds.  Abdominal:     General: Abdomen is flat.     Palpations: Abdomen is soft.      Tenderness: There is no abdominal tenderness.  Musculoskeletal:        General: No swelling. Normal range of motion.     Cervical back: Neck supple.  Lymphadenopathy:     Cervical: No cervical adenopathy.  Skin:    General: Skin is warm and dry.  Neurological:     General: No focal deficit present.     Mental Status: She is alert.  Psychiatric:        Mood and Affect: Mood normal.     ED Results / Procedures / Treatments   EKG None  Procedures Procedures  Medications Ordered in the ED Medications - No data to display  Initial Impression and Plan  Patient here with URI symptoms. Will check for Covid/Flu and strep.   ED Course   Clinical Course as of 02/20/22 0456  Mon Feb 20, 2022  Feb 22, 2022 Strep is neg.  [CS]  0448 Covid/Flu are neg.  [CS]  [phone] Patient likely with viral URI, recommend she continue with supportive care at home. PCP follow up if not improving. RTED for any other concerns.  [CS]    Clinical Course User Index [CS] 2979, MD     MDM Rules/Calculators/A&P Medical Decision Making Problems Addressed: Viral upper  respiratory tract infection: acute illness or injury  Amount and/or Complexity of Data Reviewed Labs: ordered. Decision-making details documented in ED Course.  Risk OTC drugs.    Final Clinical Impression(s) / ED Diagnoses Final diagnoses:  Viral upper respiratory tract infection    Rx / DC Orders ED Discharge Orders     None        Truddie Hidden, MD 02/20/22 [phone]

## 2022-02-20 NOTE — ED Notes (Signed)
Went over dc papers. Verbalized understanding. Ambulatory to lobby.  

## 2022-02-20 NOTE — ED Triage Notes (Signed)
Pt arrived via POV c/o sore throat that began yesterday, accompanied with 1 episode of N/V and a temp of 101F. Pt reports taking Tylenol apprx around 0000 today PTA.

## 2022-02-21 ENCOUNTER — Telehealth: Payer: Self-pay

## 2022-02-21 NOTE — Telephone Encounter (Signed)
Transition Care Management Unsuccessful Follow-up Telephone Call  Date of discharge and from where:  02/20/2022 [facility]  Attempts:  1st Attempt  Reason for unsuccessful TCM follow-up call:  Left voice message

## 2022-02-22 NOTE — Telephone Encounter (Signed)
Transition Care Management Follow-up Telephone Call Date of discharge and from where: 1113/23 [facility] How have you been since you were released from the hospital? Patient is doing well  Any questions or concerns? Yes  Items Reviewed: Did the pt receive and understand the discharge instructions provided? Yes  Medications obtained and verified? Yes  Other? Yes  Any new allergies since your discharge? No  Dietary orders reviewed? Yes Do you have support at home? Yes   Home Care and Equipment/Supplies: Were home health services ordered? not applicable If so, what is the name of the agency? na  Has the agency set up a time to come to the patient's home? no Were any new equipment or medical supplies ordered?  No What is the name of the medical supply agency? na Were you able to get the supplies/equipment? no Do you have any questions related to the use of the equipment or supplies? No  Functional Questionnaire: (I = Independent and D = Dependent) ADLs: I  Bathing/Dressing- I  Meal Prep- I  Eating- I  Maintaining continence- I  Transferring/Ambulation- I  Managing Meds- I  Follow up appointments reviewed:  PCP Hospital f/u appt confirmed?  [facility] AT THIS TIME WILL FOLLOW UP IF SYMPTOMS FAIL TO IMPROVE OR WORSEN    Specialist Hospital f/u appt confirmed?  NA   Are transportation arrangements needed? No  If their condition worsens, is the pt aware to call PCP or go to the Emergency Dept.? Yes Was the patient provided with contact information for the PCP's office or ED? Yes Was to pt encouraged to call back with questions or concerns? Yes

## 2022-04-17 IMAGING — DX DG CHEST 1V PORT
1 series · 1 of 1 positions shown · non-contrast
Comparison: March 11, 2020

CLINICAL DATA: Fever

EXAM:
PORTABLE CHEST 1 VIEW

[chest ap]
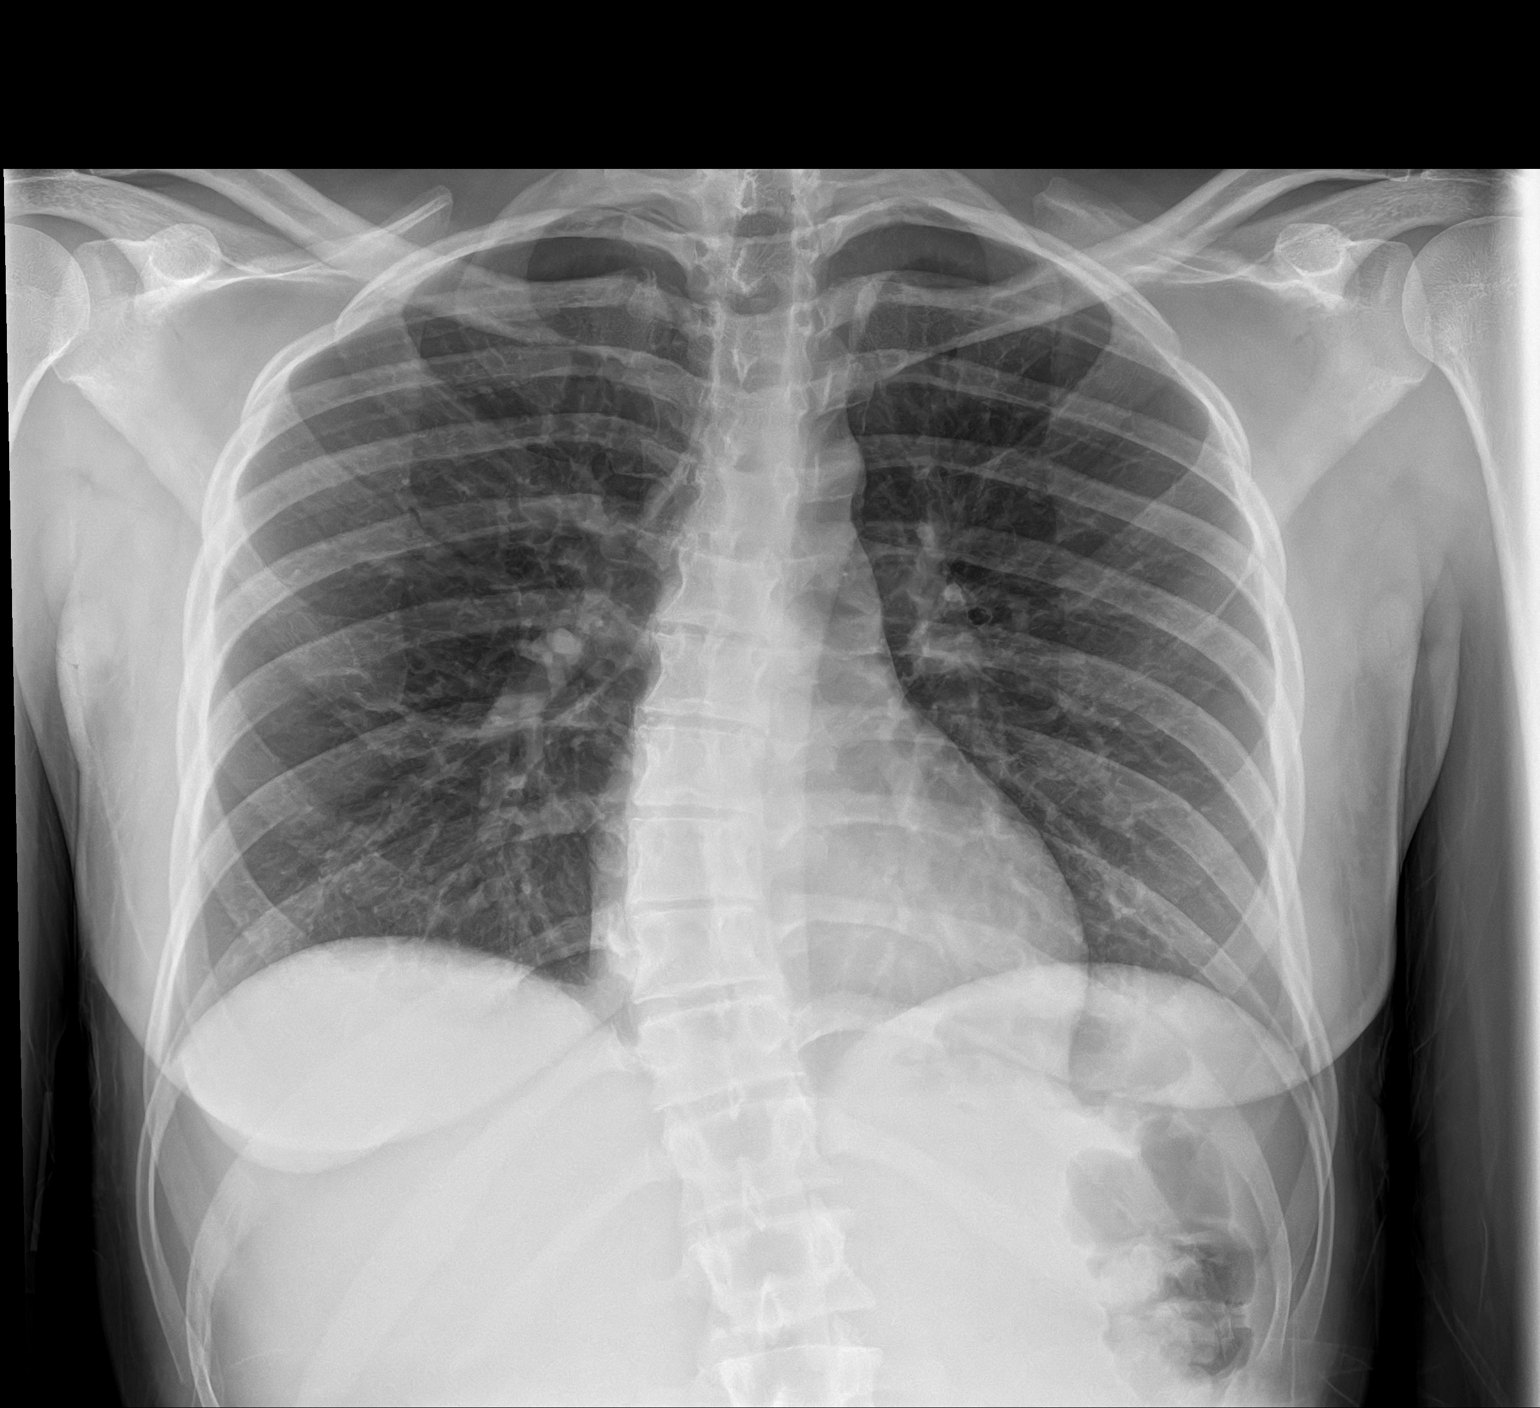

[1 of 1 positions shown; findings below may reference images not displayed]

FINDINGS: Lungs are clear. The heart size and pulmonary vascularity are
normal. No adenopathy. There is mid to lower thoracic
dextroscoliosis.
IMPRESSION: Lungs clear.  Cardiac silhouette normal.

## 2022-12-09 ENCOUNTER — Encounter (HOSPITAL_COMMUNITY): Payer: Self-pay

## 2022-12-09 ENCOUNTER — Other Ambulatory Visit: Payer: Self-pay

## 2022-12-09 ENCOUNTER — Emergency Department (HOSPITAL_COMMUNITY)
Admission: EM | Admit: 2022-12-09 | Discharge: 2022-12-09 | Disposition: A | Discharge status: Home / Self Care | Payer: Medicaid Other | Attending: Emergency Medicine | Admitting: Emergency Medicine

## 2022-12-09 DIAGNOSIS — Z5189 Encounter for other specified aftercare: Secondary | ICD-10-CM

## 2022-12-09 DIAGNOSIS — Z4801 Encounter for change or removal of surgical wound dressing: Secondary | ICD-10-CM | POA: Insufficient documentation

## 2022-12-09 NOTE — ED Triage Notes (Signed)
Pt reports she had and I&D of a left axillary abscess at urgent care 2 days ago.  Pt reports she has not picked up the antibiotics and was supposed to return to UC yesterday for them to check the packing but "they were packed so I didn't wait".

## 2022-12-09 NOTE — Discharge Instructions (Addendum)
As discussed, wound appears like it is healing well.  Continue to wash gently with warm soapy water and continue at least daily bandage changes.  Continue take antibiotic as prescribed as well as Motrin for pain/discomfort.  Please do not hesitate to return to emergency department if there are worrisome signs and symptoms we discussed become apparent.

## 2022-12-09 NOTE — ED Provider Notes (Signed)
Lisa Gregory   CSN: [id number] Arrival date & time: 12/09/22  [id number]     History  Chief Complaint  Patient presents with   Abscess    Lisa Gregory is a 22 y.o. female.   Abscess   22 year old female presents emergency department with request of wound check.  Patient had abscess on her left armpit drained on 8/27 by urgent care.  States that packing has not been removed since then.  Was placed on antibiotics in the form doxycycline the patient has not picked up prescription.  States the area has been tender and is noticed "a little bit of drainage" but overall, symptoms have been improving since area straining.  Presents because packing material is still in wound and is unsure whether or not it is to be removed.  Denies any fever, chills, night sweats.  Past medical history significant for GERD, ADHD, anemia  Home Medications Prior to Admission medications   Medication Sig Start Date End Date Taking? Authorizing Provider  esomeprazole (NEXIUM) 40 MG capsule TAKE 1 CAPSULE BY MOUTH IN THE MORNING BEFORE BREAKFAST 03/11/20   [provider]  Ferrous Fumarate (HEMOCYTE) 324 (106 Fe) MG TABS tablet Take 1 tablet (106 mg of iron total) by mouth 2 (two) times daily. 07/15/20   Mechele Claude, MD  metroNIDAZOLE (FLAGYL) 500 MG tablet Take 1 tablet (500 mg total) by mouth 2 (two) times daily. 09/13/21   Daryll Drown, NP      Allergies    Patient has no known allergies.    Review of Systems   Review of Systems  All other systems reviewed and are negative.   Physical Exam Updated Vital Signs Ht 5\' 3"  (1.6 m)   Wt 70.3 kg   LMP 11/25/2022 (Approximate)   BMI 27.46 kg/m  Physical Exam Vitals and nursing Gregory reviewed.  Constitutional:      General: She is not in acute distress.    Appearance: She is well-developed.  HENT:     Head: Normocephalic and atraumatic.  Eyes:     Conjunctiva/sclera: Conjunctivae  normal.  Cardiovascular:     Rate and Rhythm: Normal rate and regular rhythm.     Heart sounds: No murmur heard. Pulmonary:     Effort: Pulmonary effort is normal. No respiratory distress.     Breath sounds: Normal breath sounds.  Abdominal:     Palpations: Abdomen is soft.     Tenderness: There is no abdominal tenderness.  Musculoskeletal:        General: No swelling.     Cervical back: Neck supple.  Skin:    General: Skin is warm and dry.     Capillary Refill: Capillary refill takes less than 2 seconds.     Comments: Patient with approximately 1 cm linear laceration appreciated in left axillary space with packing material in place.  No surrounding erythema, palpable fluctuance/induration.  Scant amount of drainage appreciated on tail of packing material.  Neurological:     Mental Status: She is alert.  Psychiatric:        Mood and Affect: Mood normal.     ED Results / Procedures / Treatments   Labs (all labs ordered are listed, but only abnormal results are displayed) Labs Reviewed - No data to display  EKG None  Radiology No results found.  Procedures .Marland KitchenIncision and Drainage  Date/Time: 12/09/2022 9:26 AM  Performed by: Peter Garter, PA Authorized by: Peter Garter, PA  Consent:    Consent obtained:  Verbal   Consent given by:  Patient   Risks discussed:  Bleeding, incomplete drainage, pain and damage to other organs   Alternatives discussed:  No treatment Universal protocol:    Procedure explained and questions answered to patient or proxy's satisfaction: yes     Relevant documents present and verified: yes     Test results available : yes     Imaging studies available: yes     Required blood products, implants, devices, and special equipment available: yes     Site/side marked: yes     Immediately prior to procedure, a time out was called: yes     Patient identity confirmed:  Verbally with patient Location:    Type:  Abscess   Location:  Upper  extremity   Upper extremity location:  Arm   Arm location:  L upper arm Procedure type:    Complexity:  Complex Post-procedure details:    Procedure completion:  Tolerated well, no immediate complications Comments:     Patient already had incision and drainage performed by the urgent care on 12/05/2022.  Packing to be removed along the ED.  Area irrigated gently with normal saline with no purulent drainage.  Will apply sterile dressing and not put packing material back into the wound.     Medications Ordered in ED Medications - No data to display  ED Course/ Medical Decision Making/ A&P                                 Medical Decision Making  This patient presents to the ED for concern of wound check, this involves an extensive number of treatment options, and is a complaint that carries with it a high risk of complications and morbidity.  The differential diagnosis includes cellulitis, erysipelas, abscess, sepsis, necrotizing infection   Co morbidities that complicate the patient evaluation  See HPI   Additional history obtained:  Additional history obtained from EMR External records from outside source obtained and reviewed including hospital records   Lab Tests:  N/a   Imaging Studies ordered:  N/a   Cardiac Monitoring: / EKG:  The patient was maintained on a cardiac monitor.  I personally viewed and interpreted the cardiac monitored which showed an underlying rhythm of: Sinus rhythm   Consultations Obtained:  N/a   Problem List / ED Course / Critical interventions / Medication management  Wound check Reevaluation of the patient showed that the patient stayed the same I have reviewed the patients home medicines and have made adjustments as needed   Social Determinants of Health:  Denies tobacco, licit drug use   Test / Admission - Considered:  Wound check Vitals signs normal range and stable throughout visit. 22 year old female presents  emergency department with request of wound check.  Patient had left axillary abscess drained on 12/05/2022.  Area nonerythematous without induration or copious amounts of purulent drainage.  Patient mainly concerned about packing material it has been in place since wound was drained.  Packing material removed with slight irrigation without purulent return.  No indication for repacking at this time given rather shallow nature of wound.  Will recommend continued antibiotic course as prescribed by prior provider.  Further workup deemed unecessary at this time.  Treatment plan discussed at length with patient and she acknowledged understanding was agreeable to said plan.  Patient overall well-appearing, afebrile in no acute distress. Worrisome  signs and symptoms were discussed with the patient, and the patient acknowledged understanding to return to the ED if noticed. Patient was stable upon discharge.          Final Clinical Impression(s) / ED Diagnoses Final diagnoses:  Wound check, abscess    Rx / DC Orders ED Discharge Orders     None         Peter Garter, [place] 12/09/22 [id number]    Bethann Berkshire, MD 12/09/22 [phone]

## 2023-05-09 ENCOUNTER — Encounter: Payer: Medicaid Other | Admitting: Family Medicine

## 2023-08-22 ENCOUNTER — Ambulatory Visit

## 2023-08-30 ENCOUNTER — Other Ambulatory Visit (HOSPITAL_COMMUNITY)
Admission: RE | Admit: 2023-08-30 | Discharge: 2023-08-30 | Disposition: A | Discharge status: Home / Self Care | Source: Ambulatory Visit | Attending: Family Medicine | Admitting: Family Medicine

## 2023-08-30 ENCOUNTER — Ambulatory Visit: Payer: Self-pay | Admitting: Family Medicine

## 2023-08-30 ENCOUNTER — Ambulatory Visit (INDEPENDENT_AMBULATORY_CARE_PROVIDER_SITE_OTHER): Admitting: Family Medicine

## 2023-08-30 ENCOUNTER — Encounter: Payer: Self-pay | Admitting: Family Medicine

## 2023-08-30 VITALS — BP 122/75 | HR 91 | Temp 97.8°F | Ht 63.0 in | Wt 167.8 lb

## 2023-08-30 DIAGNOSIS — Z113 Encounter for screening for infections with a predominantly sexual mode of transmission: Secondary | ICD-10-CM | POA: Diagnosis present

## 2023-08-30 DIAGNOSIS — Z0000 Encounter for general adult medical examination without abnormal findings: Secondary | ICD-10-CM | POA: Diagnosis not present

## 2023-08-30 DIAGNOSIS — A599 Trichomoniasis, unspecified: Secondary | ICD-10-CM

## 2023-08-30 DIAGNOSIS — Z3046 Encounter for surveillance of implantable subdermal contraceptive: Secondary | ICD-10-CM

## 2023-08-30 DIAGNOSIS — Z136 Encounter for screening for cardiovascular disorders: Secondary | ICD-10-CM

## 2023-08-30 DIAGNOSIS — Z130 Encounter for screening for diseases of the blood and blood-forming organs and certain disorders involving the immune mechanism: Secondary | ICD-10-CM

## 2023-08-30 DIAGNOSIS — Z30011 Encounter for initial prescription of contraceptive pills: Secondary | ICD-10-CM | POA: Diagnosis not present

## 2023-08-30 LAB — URINE CYTOLOGY ANCILLARY ONLY
Chlamydia: NEGATIVE
Comment: NEGATIVE
Comment: NEGATIVE
Comment: NORMAL
Neisseria Gonorrhea: NEGATIVE
Trichomonas: POSITIVE — AB

## 2023-08-30 LAB — PREGNANCY, URINE: Preg Test, Ur: NEGATIVE

## 2023-08-30 MED ORDER — NORETHINDRONE ACET-ETHINYL EST 1-20 MG-MCG PO TABS: 1.0000 | ORAL_TABLET | Freq: Every day | ORAL | 11 refills | Status: AC
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
## 2023-08-31 LAB — RPR: RPR Ser Ql: NONREACTIVE

## 2023-08-31 LAB — ANEMIA PROFILE B
Basophils Absolute: 0.1 10*3/uL (ref 0.0–0.2)
Basos: 1 %
EOS (ABSOLUTE): 0.2 10*3/uL (ref 0.0–0.4)
Eos: 2 %
Ferritin: 48 ng/mL (ref 15–150)
Folate: >20 ng/mL (ref >3.0)
Hematocrit: 36.6 % (ref 34.0–46.6)
Hemoglobin: 12.2 g/dL (ref 11.1–15.9)
Immature Grans (Abs): 0 10*3/uL (ref 0.0–0.1)
Immature Granulocytes: 0 %
Iron Saturation: 12 % — ABNORMAL LOW (ref 15–55)
Iron: 45 ug/dL (ref 27–159)
Lymphocytes Absolute: 3.2 10*3/uL — ABNORMAL HIGH (ref 0.7–3.1)
Lymphs: 35 %
MCH: 29.5 pg (ref 26.6–33.0)
MCHC: 33.3 g/dL (ref 31.5–35.7)
MCV: 88 fL (ref 79–97)
Monocytes Absolute: 0.8 10*3/uL (ref 0.1–0.9)
Monocytes: 9 %
Neutrophils Absolute: 5 10*3/uL (ref 1.4–7.0)
Neutrophils: 53 %
Platelets: 297 10*3/uL (ref 150–450)
RBC: 4.14 x10E6/uL (ref 3.77–5.28)
RDW: 13.1 % (ref 11.7–15.4)
Retic Ct Pct: 2.6 % (ref 0.6–2.6)
Total Iron Binding Capacity: 361 ug/dL (ref 250–450)
UIBC: 316 ug/dL (ref 131–425)
Vitamin B-12: 446 pg/mL (ref 232–1245)
WBC: 9.3 10*3/uL (ref 3.4–10.8)

## 2023-08-31 LAB — CMP14+EGFR
ALT: 13 IU/L (ref 0–32)
AST: 19 IU/L (ref 0–40)
Albumin: 4.3 g/dL (ref 4.0–5.0)
Alkaline Phosphatase: 52 IU/L (ref 44–121)
BUN/Creatinine Ratio: 13 (ref 9–23)
BUN: 7 mg/dL (ref 6–20)
Bilirubin Total: <0.2 mg/dL (ref 0.0–1.2)
CO2: 19 mmol/L — ABNORMAL LOW (ref 20–29)
Calcium: 9.7 mg/dL (ref 8.7–10.2)
Chloride: 104 mmol/L (ref 96–106)
Creatinine, Ser: 0.55 mg/dL — ABNORMAL LOW (ref 0.57–1.00)
Globulin, Total: 3.4 g/dL (ref 1.5–4.5)
Glucose: 85 mg/dL (ref 70–99)
Potassium: 4.3 mmol/L (ref 3.5–5.2)
Sodium: 139 mmol/L (ref 134–144)
Total Protein: 7.7 g/dL (ref 6.0–8.5)
eGFR: 131 mL/min/{1.73_m2} (ref >59)

## 2023-08-31 LAB — HEPB+HEPC+HIV PANEL
HIV Screen 4th Generation wRfx: NONREACTIVE
Hep B C IgM: NEGATIVE
Hep B Core Total Ab: NEGATIVE
Hep B E Ab: NONREACTIVE
Hep B E Ag: NEGATIVE
Hep B Surface Ab, Qual: NONREACTIVE
Hep C Virus Ab: NONREACTIVE
Hepatitis B Surface Ag: NEGATIVE

## 2023-08-31 LAB — THYROID PANEL WITH TSH
Free Thyroxine Index: 2 (ref 1.2–4.9)
T3 Uptake Ratio: 25 % (ref 24–39)
T4, Total: 8 ug/dL (ref 4.5–12.0)
TSH: 0.864 u[IU]/mL (ref 0.450–4.500)

## 2023-08-31 LAB — LIPID PANEL
Chol/HDL Ratio: 2.7 ratio (ref 0.0–4.4)
Cholesterol, Total: 139 mg/dL (ref 100–199)
HDL: 52 mg/dL (ref >39)
LDL Chol Calc (NIH): 71 mg/dL (ref 0–99)
Triglycerides: 81 mg/dL (ref 0–149)
VLDL Cholesterol Cal: 16 mg/dL (ref 5–40)

## 2023-08-31 MED ORDER — METRONIDAZOLE 500 MG PO TABS
500.0000 mg | ORAL_TABLET | Freq: Two times a day (BID) | ORAL | 0 refills | Status: AC
Start: 2023-08-31 — End: 2023-09-07

## 2023-12-27 ENCOUNTER — Ambulatory Visit: Admitting: Family Medicine

## 2023-12-27 ENCOUNTER — Encounter: Payer: Self-pay | Admitting: Family Medicine

## 2023-12-27 ENCOUNTER — Ambulatory Visit: Payer: Self-pay | Admitting: Family Medicine

## 2023-12-27 VITALS — BP 137/87 | HR 96 | Temp 97.1°F | Ht 63.0 in | Wt 173.0 lb

## 2023-12-27 DIAGNOSIS — Z3200 Encounter for pregnancy test, result unknown: Secondary | ICD-10-CM | POA: Diagnosis not present

## 2023-12-27 DIAGNOSIS — N760 Acute vaginitis: Secondary | ICD-10-CM

## 2023-12-27 DIAGNOSIS — Z9189 Other specified personal risk factors, not elsewhere classified: Secondary | ICD-10-CM | POA: Diagnosis not present

## 2023-12-27 LAB — PREGNANCY, URINE: Preg Test, Ur: NEGATIVE
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
## 2023-12-28 LAB — HEPB+HEPC+HIV PANEL
HIV Screen 4th Generation wRfx: NONREACTIVE
Hep B C IgM: NEGATIVE
Hep B Core Total Ab: NEGATIVE
Hep B E Ab: NONREACTIVE
Hep B E Ag: NEGATIVE
Hep B Surface Ab, Qual: NONREACTIVE
Hep C Virus Ab: NONREACTIVE
Hepatitis B Surface Ag: NEGATIVE

## 2023-12-28 LAB — RPR: RPR Ser Ql: NONREACTIVE

## 2023-12-29 LAB — NUSWAB VAGINITIS PLUS (VG+)
Atopobium vaginae: HIGH {score} — AB
BVAB 2: HIGH {score} — AB
Candida albicans, NAA: NEGATIVE
Candida glabrata, NAA: NEGATIVE
Chlamydia trachomatis, NAA: NEGATIVE
Megasphaera 1: HIGH {score} — AB
Neisseria gonorrhoeae, NAA: NEGATIVE
Trich vag by NAA: NEGATIVE

## 2023-12-30 MED ORDER — METRONIDAZOLE 500 MG PO TABS: 500.0000 mg | ORAL_TABLET | Freq: Two times a day (BID) | ORAL | 0 refills | Status: AC

## 2024-01-25 ENCOUNTER — Encounter: Payer: Self-pay | Admitting: Family Medicine

## 2024-01-30 ENCOUNTER — Ambulatory Visit: Admitting: Family Medicine

## 2024-01-30 ENCOUNTER — Encounter: Payer: Self-pay | Admitting: Family Medicine

## 2024-01-30 VITALS — BP 137/85 | HR 94 | Temp 97.7°F | Ht 63.0 in | Wt 170.2 lb

## 2024-01-30 DIAGNOSIS — Z3200 Encounter for pregnancy test, result unknown: Secondary | ICD-10-CM

## 2024-01-30 DIAGNOSIS — N926 Irregular menstruation, unspecified: Secondary | ICD-10-CM
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
## 2024-01-31 ENCOUNTER — Ambulatory Visit: Payer: Self-pay | Admitting: Family Medicine

## 2024-01-31 LAB — PREGNANCY, URINE: Preg Test, Ur: NEGATIVE

## 2024-01-31 LAB — BETA HCG QUANT (REF LAB): hCG Quant: <1 m[IU]/mL

## 2024-02-06 ENCOUNTER — Ambulatory Visit: Admitting: Family Medicine

## 2024-02-12 ENCOUNTER — Ambulatory Visit: Admitting: Family Medicine

## 2024-05-06 ENCOUNTER — Ambulatory Visit: Admitting: Family Medicine
